# Patient Record
Sex: Male | Born: 1990 | Race: White | Hispanic: No | Marital: Single | Smoking: Never smoker
Health system: Southern US, Community
[De-identification: ages and names within clinical notes are randomized; demographics above are authoritative.]

---
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
## 2012-05-27 ENCOUNTER — Ambulatory Visit: Payer: Self-pay | Admitting: Otolaryngology

## 2014-10-04 ENCOUNTER — Encounter: Payer: Self-pay | Admitting: *Deleted

## 2014-10-04 ENCOUNTER — Emergency Department
Admission: EM | Admit: 2014-10-04 | Discharge: 2014-10-04 | Disposition: A | Discharge status: Home / Self Care | Payer: 59 | Attending: Emergency Medicine | Admitting: Emergency Medicine

## 2014-10-04 DIAGNOSIS — E860 Dehydration: Secondary | ICD-10-CM | POA: Diagnosis not present

## 2014-10-04 DIAGNOSIS — R1110 Vomiting, unspecified: Secondary | ICD-10-CM | POA: Diagnosis not present

## 2014-10-04 LAB — COMPREHENSIVE METABOLIC PANEL
ALK PHOS: 72 U/L (ref 38–126)
ALT: 25 U/L (ref 17–63)
ANION GAP: 7 (ref 5–15)
AST: 23 U/L (ref 15–41)
Albumin: 4.6 g/dL (ref 3.5–5.0)
BILIRUBIN TOTAL: 0.9 mg/dL (ref 0.3–1.2)
BUN: 9 mg/dL (ref 6–20)
CALCIUM: 9.2 mg/dL (ref 8.9–10.3)
CO2: 27 mmol/L (ref 22–32)
Chloride: 111 mmol/L (ref 101–111)
Creatinine, Ser: 0.62 mg/dL (ref 0.61–1.24)
GFR calc non Af Amer: >60 mL/min (ref >60)
GLUCOSE: 98 mg/dL (ref 65–99)
Potassium: 3.8 mmol/L (ref 3.5–5.1)
Sodium: 145 mmol/L (ref 135–145)
TOTAL PROTEIN: 7.3 g/dL (ref 6.5–8.1)

## 2014-10-04 LAB — CBC
HCT: 42.9 % (ref 40.0–52.0)
HEMOGLOBIN: 14.6 g/dL (ref 13.0–18.0)
MCH: 33.7 pg (ref 26.0–34.0)
MCHC: 34.1 g/dL (ref 32.0–36.0)
MCV: 98.8 fL (ref 80.0–100.0)
Platelets: 166 10*3/uL (ref 150–440)
RBC: 4.34 MIL/uL — AB (ref 4.40–5.90)
RDW: 13 % (ref 11.5–14.5)
WBC: 9.7 10*3/uL (ref 3.8–10.6)
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
## 2015-11-17 ENCOUNTER — Emergency Department
Admission: EM | Admit: 2015-11-17 | Discharge: 2015-11-17 | Disposition: A | Discharge status: Home / Self Care | Payer: 59 | Attending: Emergency Medicine | Admitting: Emergency Medicine

## 2015-11-17 ENCOUNTER — Emergency Department: Payer: 59

## 2015-11-17 DIAGNOSIS — S01511A Laceration without foreign body of lip, initial encounter: Secondary | ICD-10-CM | POA: Insufficient documentation

## 2015-11-17 DIAGNOSIS — Z23 Encounter for immunization: Secondary | ICD-10-CM | POA: Insufficient documentation

## 2015-11-17 DIAGNOSIS — Y999 Unspecified external cause status: Secondary | ICD-10-CM | POA: Diagnosis not present

## 2015-11-17 DIAGNOSIS — S0101XA Laceration without foreign body of scalp, initial encounter: Secondary | ICD-10-CM | POA: Diagnosis not present

## 2015-11-17 DIAGNOSIS — S50312A Abrasion of left elbow, initial encounter: Secondary | ICD-10-CM | POA: Diagnosis not present

## 2015-11-17 DIAGNOSIS — Y9289 Other specified places as the place of occurrence of the external cause: Secondary | ICD-10-CM | POA: Insufficient documentation

## 2015-11-17 DIAGNOSIS — S60221A Contusion of right hand, initial encounter: Secondary | ICD-10-CM | POA: Diagnosis not present

## 2015-11-17 DIAGNOSIS — S80212A Abrasion, left knee, initial encounter: Secondary | ICD-10-CM | POA: Insufficient documentation

## 2015-11-17 DIAGNOSIS — Y9389 Activity, other specified: Secondary | ICD-10-CM | POA: Insufficient documentation

## 2015-11-17 DIAGNOSIS — S0990XA Unspecified injury of head, initial encounter: Secondary | ICD-10-CM | POA: Diagnosis present

## 2015-11-17 DIAGNOSIS — S80211A Abrasion, right knee, initial encounter: Secondary | ICD-10-CM | POA: Diagnosis not present

## 2015-11-17 DIAGNOSIS — T07XXXA Unspecified multiple injuries, initial encounter: Secondary | ICD-10-CM

## 2015-11-17 MED ORDER — TETANUS-DIPHTH-ACELL PERTUSSIS 5-2.5-18.5 LF-MCG/0.5 IM SUSP
0.5000 mL | Freq: Once | INTRAMUSCULAR | Status: AC
  Administered 2015-11-17: 0.5 mL via INTRAMUSCULAR
  Filled 2015-11-17: qty 0.5

## 2015-11-17 MED ORDER — LIDOCAINE HCL (PF) 1 % IJ SOLN
INTRAMUSCULAR | Status: AC
  Filled 2015-11-17: qty 5

## 2015-11-17 NOTE — ED Notes (Addendum)
RN and ED tech Mardene CelesteJoanna in room to undress patient for assessment. Patient lip cleaned.   Patient has scratches on the left side of his neck and the right back side of his neck. Patient also has bruising on the right shoulder blade, and scratches on the right shoulder. Patient has scratches on the right knee.  Patient states that he is only having pain in his face.

## 2015-11-17 NOTE — ED Notes (Signed)
RN in room to check on patient, patient sleeping soundly with parents at bedside
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
## 2015-11-17 NOTE — ED Notes (Signed)
Pt has laceration on top of head as well as left upper lip.
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
## 2015-11-17 NOTE — ED Notes (Signed)
Patient taken to CT.
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
## 2019-04-28 ENCOUNTER — Ambulatory Visit: Payer: Self-pay | Attending: Internal Medicine

## 2019-04-28 DIAGNOSIS — Z23 Encounter for immunization: Secondary | ICD-10-CM

## 2019-04-28 NOTE — Progress Notes (Signed)
   Covid-19 Vaccination Clinic  Name:  ASHLY GOETHE    MRN: [id number] DOB: [birth year]  04/28/2019  Mr. Beckford was observed post Covid-19 immunization for 15 minutes without incident. He was provided with Vaccine Information Sheet and instruction to access the V-Safe system.   Mr. Godbolt was instructed to call 911 with any severe reactions post vaccine: Marland Kitchen Difficulty breathing  . Swelling of face and throat  . A fast heartbeat  . A bad rash all over body  . Dizziness and weakness   Immunizations Administered    Name Date Dose VIS Date Route   Pfizer COVID-19 Vaccine 04/28/2019  8:13 AM 0.3 mL 01/06/2019 Intramuscular   Manufacturer: [employer], [employer]   Lot: [id number]   NDC: [address]-1

## 2019-05-24 ENCOUNTER — Ambulatory Visit: Payer: Self-pay | Attending: Internal Medicine

## 2019-05-24 DIAGNOSIS — Z23 Encounter for immunization: Secondary | ICD-10-CM

## 2019-05-24 NOTE — Progress Notes (Signed)
   Covid-19 Vaccination Clinic  Name:  Lee Richards    MRN: [id number] DOB: Nov 28, 1990  05/24/2019  Mr. [name] was observed post Covid-19 immunization for 15 minutes without incident. He was provided with Vaccine Information Sheet and instruction to access the V-Safe system.   [name] was instructed to call 911 with any severe reactions post vaccine: Marland Kitchen Difficulty breathing  . Swelling of face and throat  . A fast heartbeat  . A bad rash all over body  . Dizziness and weakness   Immunizations Administered    Name Date Dose VIS Date Route   Pfizer COVID-19 Vaccine 05/24/2019  8:27 AM 0.3 mL 03/22/2018 Intramuscular   Manufacturer: [employer], [employer]   Lot: [id number]   NDC: [address]-9
